# Patient Record
Sex: Male | Born: 1945 | Race: White | Hispanic: No | Marital: Married
Health system: Southern US, Community
[De-identification: ages and names within clinical notes are randomized; demographics above are authoritative.]

## PROBLEM LIST (undated history)

## (undated) HISTORY — PX: HERNIA REPAIR: SHX51

---

## 2006-08-11 ENCOUNTER — Other Ambulatory Visit: Payer: Self-pay

## 2006-08-15 ENCOUNTER — Ambulatory Visit: Payer: Self-pay | Admitting: Surgery

## 2007-07-29 ENCOUNTER — Emergency Department: Payer: Self-pay | Admitting: Emergency Medicine

## 2007-07-29 ENCOUNTER — Other Ambulatory Visit: Payer: Self-pay

## 2008-03-09 IMAGING — CT CT HEAD WITHOUT CONTRAST
2 series · 16 of 30 positions shown, 20 images · non-contrast
Comparison: none

REASON FOR EXAM: syncope
COMMENTS:

[Series 2: without · axial · non-contrast · 0.41mm/px · z∈[-144,-9]mm · 13 of 33 slices shown, 17 images]
[im 3/33  brain]
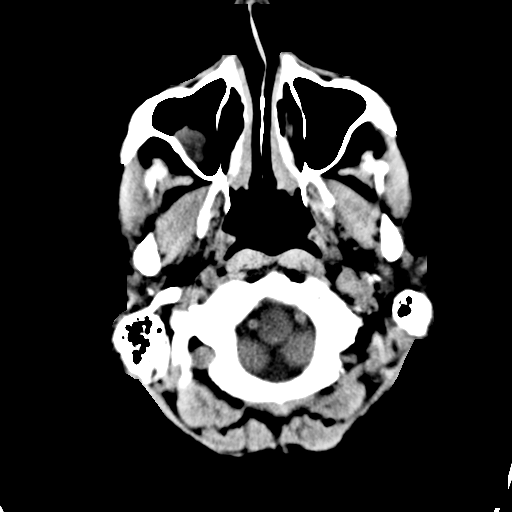
[im 3/33  bone]
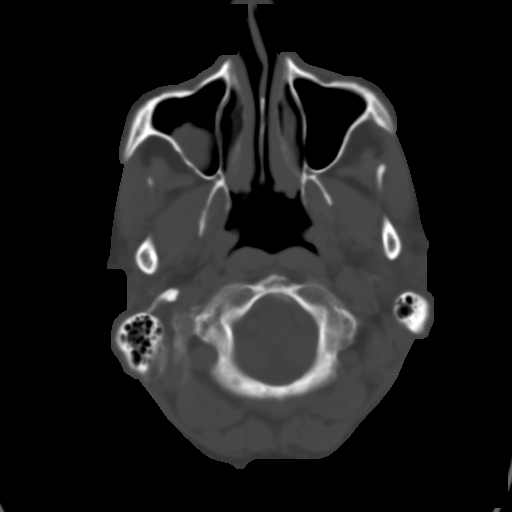
[im 5/33  brain]
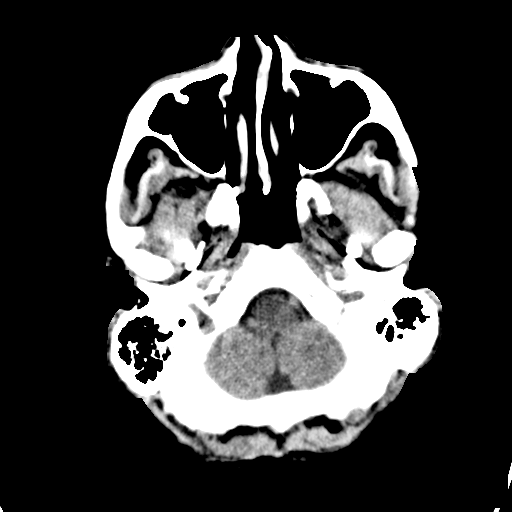
[im 7/33  brain]
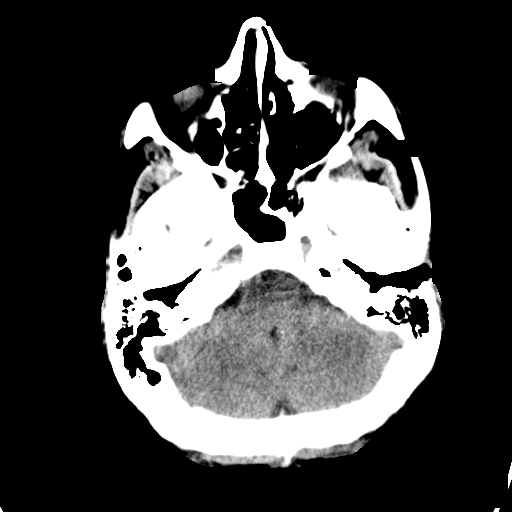
[im 10/33  brain]
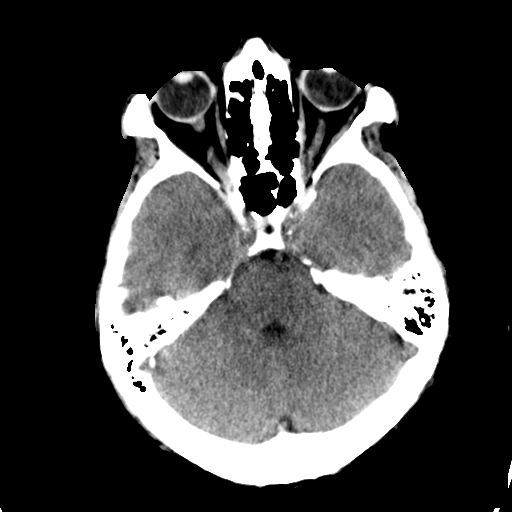
[im 12/33  brain]
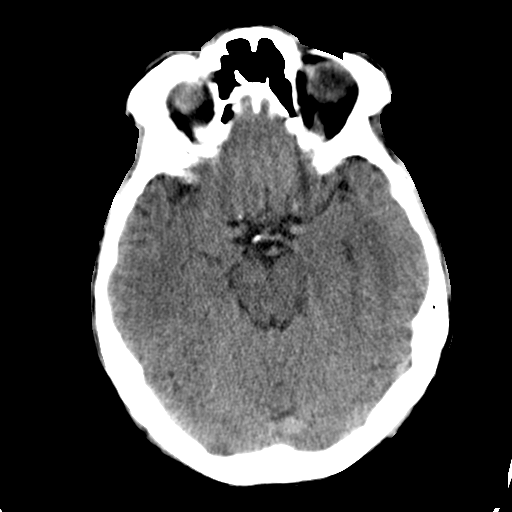
[im 12/33  bone]
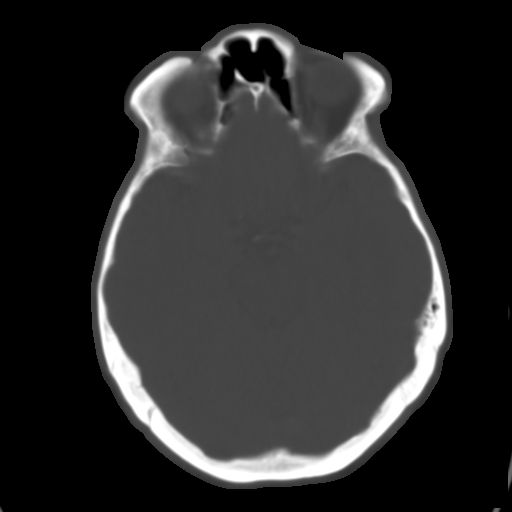
[im 14/33  brain]
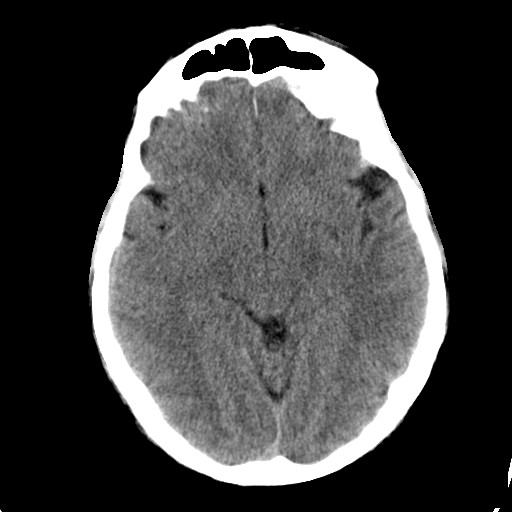
[im 17/33  brain]
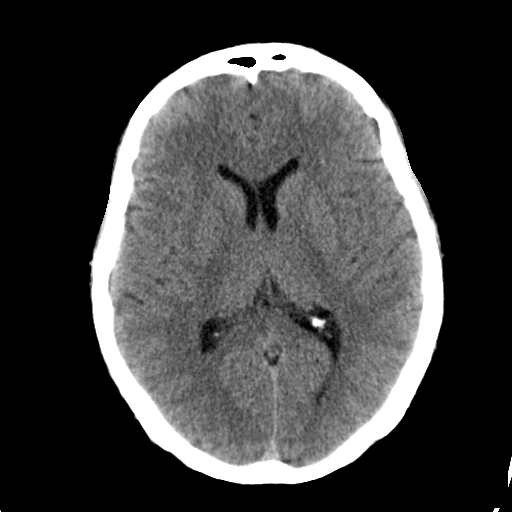
[im 19/33  brain]
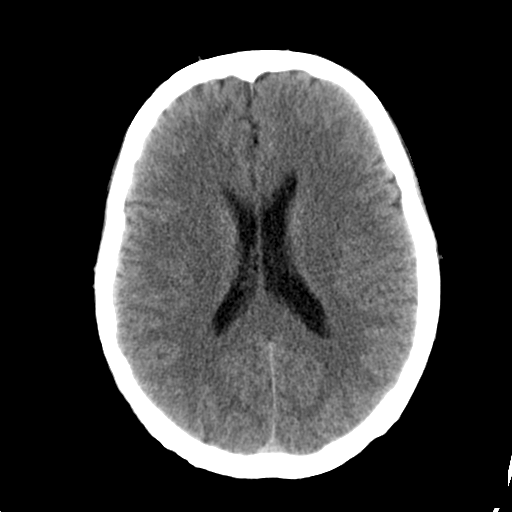
[im 21/33  brain]
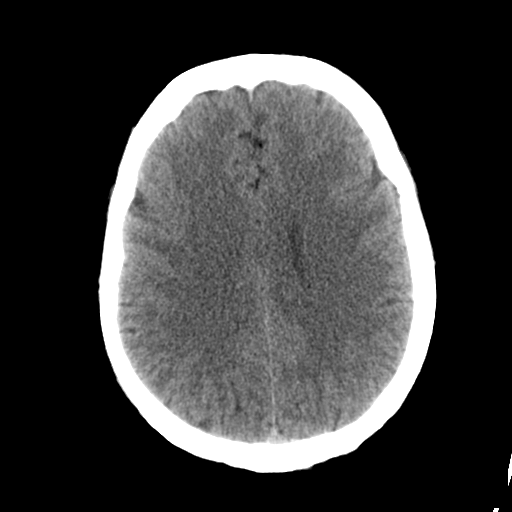
[im 21/33  bone]
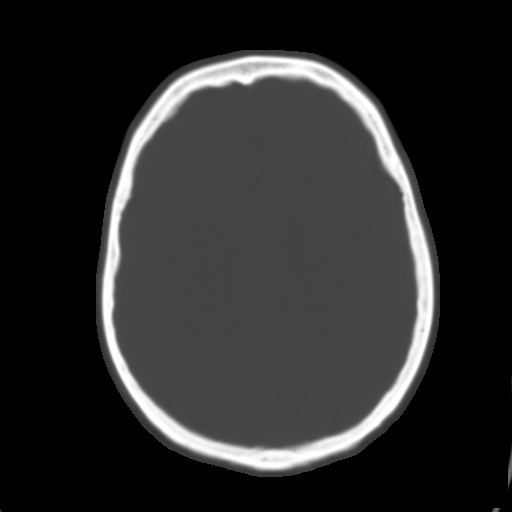
[im 23/33  brain]
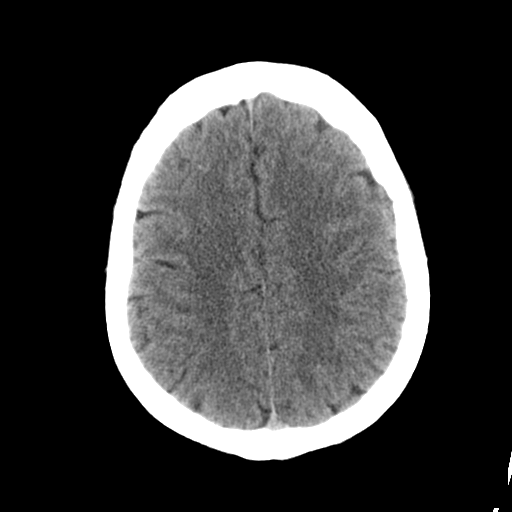
[im 26/33  brain]
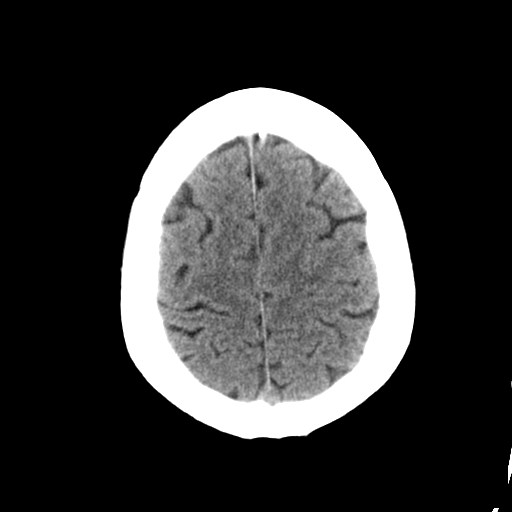
[im 28/33  brain]
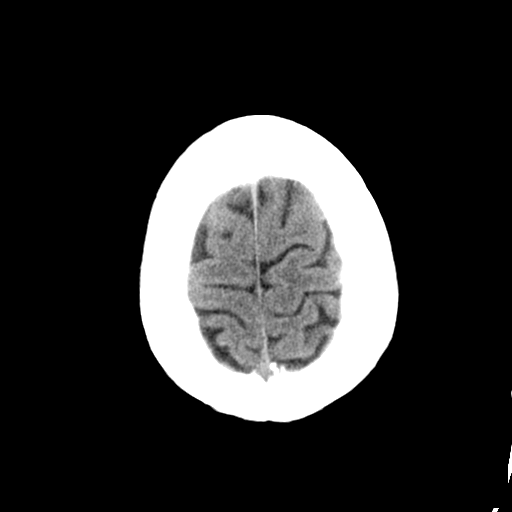
[im 30/33  brain]
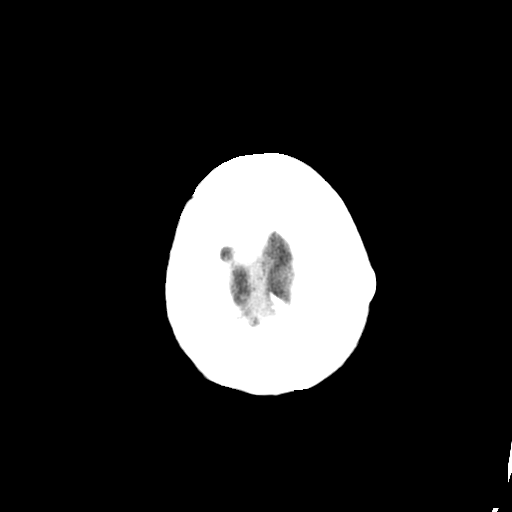
[im 30/33  bone]
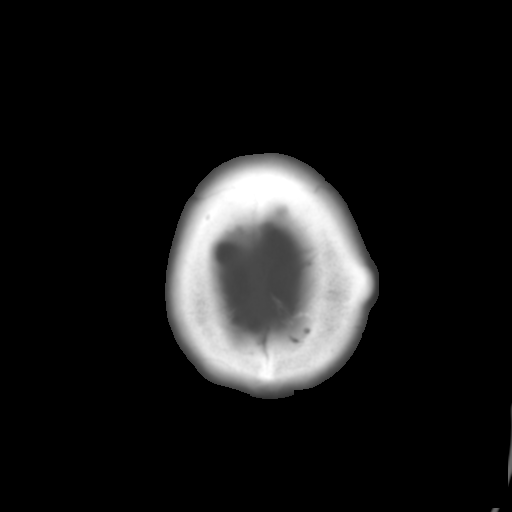

[Series 3: bone · axial · 0.41mm/px · z∈[-144,-99]mm · 3 of 33 slices shown]
[im 3/33  bone]
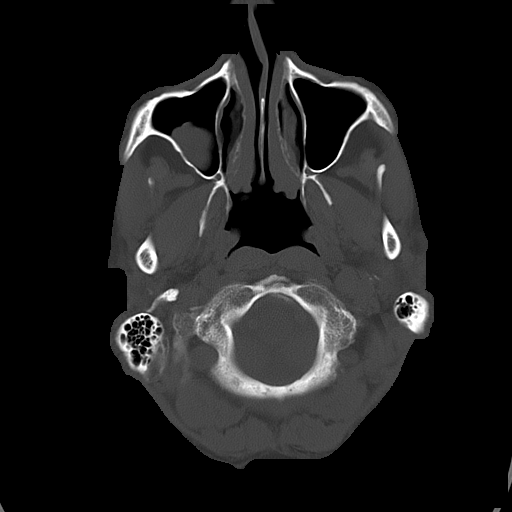
[im 7/33  bone]
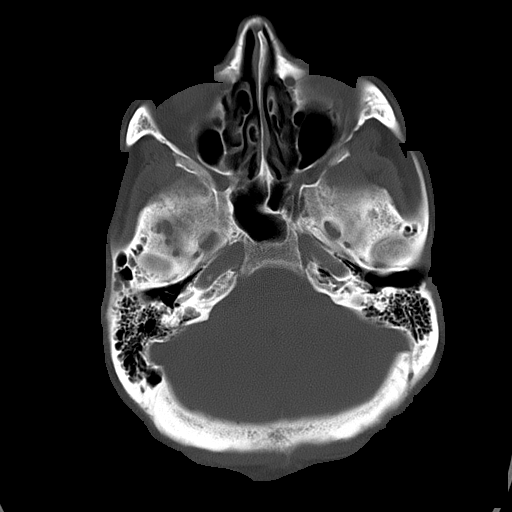
[im 12/33  bone]
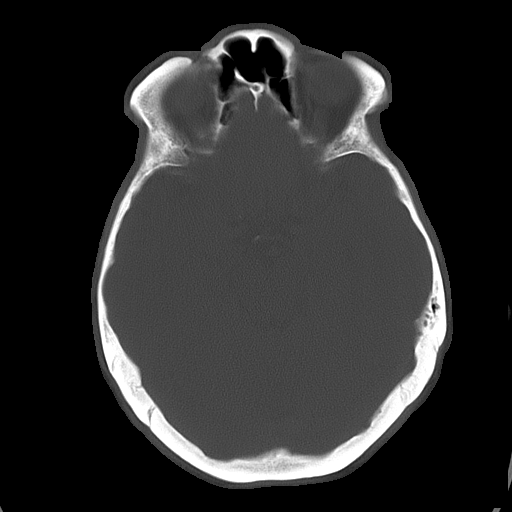

[16 of 30 positions shown; findings below may reference images not displayed]

PROCEDURE:     CT  - CT HEAD WITHOUT CONTRAST  - July 29, 2007  [DATE]

RESULT:     There is no evidence of intra-axial or extra-axial fluid
collections or evidence of acute hemorrhage. No secondary signs are
appreciated to suggest mass effect, subacute or chronic infarction. A
bulbous area of low attenuation projects within the base of the RIGHT
maxillary sinus possibly representing a mucous retention cyst.  The
visualized bony skeleton demonstrates no evidence of fracture or
dislocation.
IMPRESSION: No evidence of acute intracranial abnormalities.

Dr. Kenlyn of the Emergency Department was informed of these findings via
preliminary fax report on 07/29/07 at [DATE] p.m. CST.

## 2013-10-30 ENCOUNTER — Encounter: Payer: Self-pay | Admitting: *Deleted

## 2013-11-05 ENCOUNTER — Telehealth: Payer: Self-pay | Admitting: *Deleted

## 2013-11-05 ENCOUNTER — Ambulatory Visit (INDEPENDENT_AMBULATORY_CARE_PROVIDER_SITE_OTHER): Payer: Self-pay | Admitting: General Surgery

## 2013-11-05 ENCOUNTER — Encounter: Payer: Self-pay | Admitting: *Deleted

## 2013-11-05 DIAGNOSIS — N433 Hydrocele, unspecified: Secondary | ICD-10-CM

## 2013-11-05 NOTE — Telephone Encounter (Signed)
Message copied by Nicholes Mango on Mon Nov 05, 2013  4:19 PM ------      Message from: [place], [place] W      Created: Mon Nov 05, 2013  3:08 PM       Patient needs GU appt re: left hydrocele. With GU locally.  Thanks. ------

## 2013-11-05 NOTE — Telephone Encounter (Signed)
Patient agreeable to appointment later today.

## 2013-11-05 NOTE — Telephone Encounter (Signed)
Message for patient to call the office.  He has been scheduled for an appointment with Michiel Cowboy, P.A.  at [facility] for 11-26-13 at 3:15 pm. Their office will mail out new patient paperwork which he will need to complete and take to appointment.

## 2013-11-05 NOTE — Telephone Encounter (Signed)
Message copied by Nicholes Mango on Mon Nov 05, 2013  8:34 AM ------      Message from: Earline Mayotte      Created: Sun Nov 04, 2013 11:32 AM       Patient presently has an appt in January.  Contact at work [phone]) Monday AM and see if he can come in at 1 PM that afternoon.  He has a left inguinal hernia, and is looking to have surgery on 12/ 26.  Thanks.  (He is my Paediatric nurse) ------

## 2013-11-06 ENCOUNTER — Telehealth: Payer: Self-pay

## 2013-11-06 ENCOUNTER — Telehealth: Payer: Self-pay | Admitting: *Deleted

## 2013-11-06 DIAGNOSIS — N433 Hydrocele, unspecified: Secondary | ICD-10-CM | POA: Insufficient documentation

## 2013-11-06 NOTE — Progress Notes (Signed)
Patient ID: Joe Yoder, male   DOB: 13-Jul-1946, 67 y.o.   MRN: [id number]  No chief complaint on file.   HPI Joe Yoder is a 67 y.o. male.     HPI Comments: The patient reports a 2 week history of swelling in the left scrotum. 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   The patient noticed soreness the day of his vigorous activity in the lower abdomen, but this resolved within 40 hours. It was several days later the became aware of swelling in the left hemiscrotum.  He has not had any difficulty with bowel or bladder function. No difficulty with appetite. No urinary symptoms. No frequency or dysuria.  The patient previously underwent right than left laparoscopic inguinal hernia repair by Marshia Ly, M.D. in 2006/2007.  At that time he had had bulges in the groin rather than the scrotum.   No past medical history on file.  Past Surgical History  Procedure Laterality Date  . Hernia repair Bilateral 2007,2009    No family history on file.  Social History History  Substance Use Topics  . Smoking status: Not on file  . Smokeless tobacco: Not on file  . Alcohol Use: Not on file    No Known Allergies  No current outpatient prescriptions on file.   No current facility-administered medications for this visit.    Review of Systems Review of Systems  There were no vitals taken for this visit.  Physical Exam Physical Exam  Constitutional: He appears well-developed and well-nourished.  HENT:  Head: Normocephalic.  Neck: Normal range of motion.  Cardiovascular: Normal rate and regular rhythm.   Pulmonary/Chest: Effort normal and breath sounds normal.  Abdominal: He exhibits no mass. There is no tenderness. Hernia confirmed negative in the right inguinal area and confirmed negative in the left inguinal area.  Genitourinary: Penis normal.    Left testis shows swelling.  Lymphadenopathy:       Right: No inguinal  adenopathy present.       Left: No inguinal adenopathy present.    Data Reviewed 2006 right laparoscopic hernia repair notes.  Assessment    Left scrotal hydrocele, no evidence of recurrent hernia.    Plan    Indication for urology assessment was discussed.  As he is asymptomatic, this will be an elective appointment.        Joe Yoder 11/06/2013, 10:26 AM

## 2013-11-06 NOTE — Telephone Encounter (Signed)
Patient notified of date, time, and instructions. He verbalizes understanding.

## 2013-11-06 NOTE — Telephone Encounter (Signed)
Patient called back and was notified that he has been scheduled for an appointment with Michiel Cowboy, P.A. at [facility] for 11-26-13 at 3:15 pm. Their office will mail out new patient paperwork which he will need to complete and take to appointment.

## 2013-11-29 ENCOUNTER — Ambulatory Visit: Payer: Self-pay | Admitting: Urology

## 2013-12-03 ENCOUNTER — Ambulatory Visit: Payer: Self-pay | Admitting: General Surgery

## 2013-12-17 ENCOUNTER — Ambulatory Visit: Payer: Self-pay | Admitting: Unknown Physician Specialty

## 2013-12-21 ENCOUNTER — Ambulatory Visit: Payer: Self-pay | Admitting: Urology

## 2014-07-11 IMAGING — US US SCROTUM W/ DOPPLER COMPLETE
1 series · 13 of 25 positions shown · non-contrast
Comparison: None

CLINICAL DATA: Left testicular swelling

EXAM:
SCROTAL ULTRASOUND
DOPPLER ULTRASOUND OF THE TESTICLES
TECHNIQUE: Complete ultrasound examination of the testicles, epididymis, and
other scrotal structures was performed. Color and spectral Doppler
ultrasound were also utilized to evaluate blood flow to the
testicles.

[Series 1: us scrotum w/ doppler complete · 0.11mm/px · 13 of 61 slices shown]
[im 1/61]
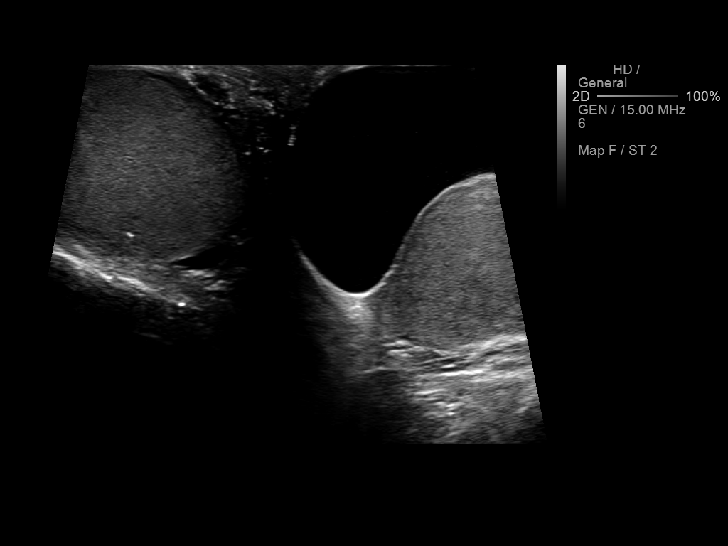
[im 6/61]
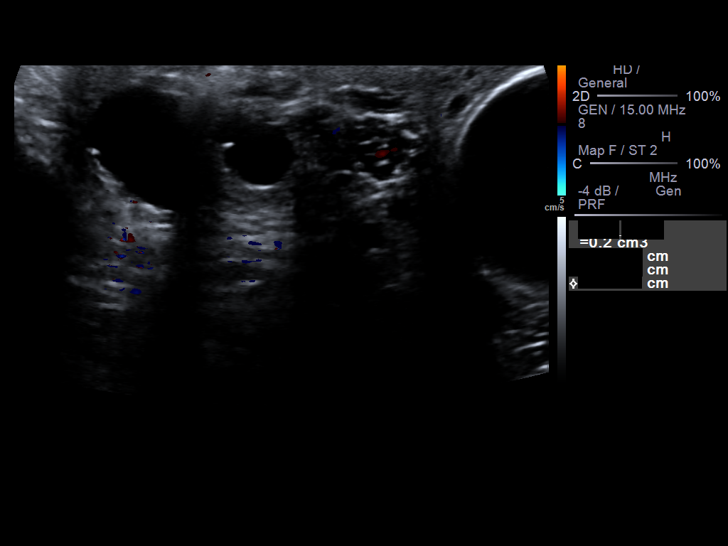
[im 11/61]
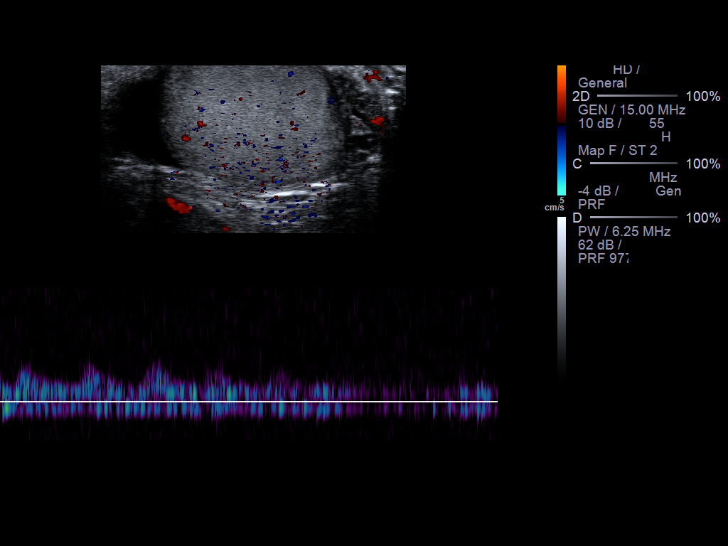
[im 16/61]
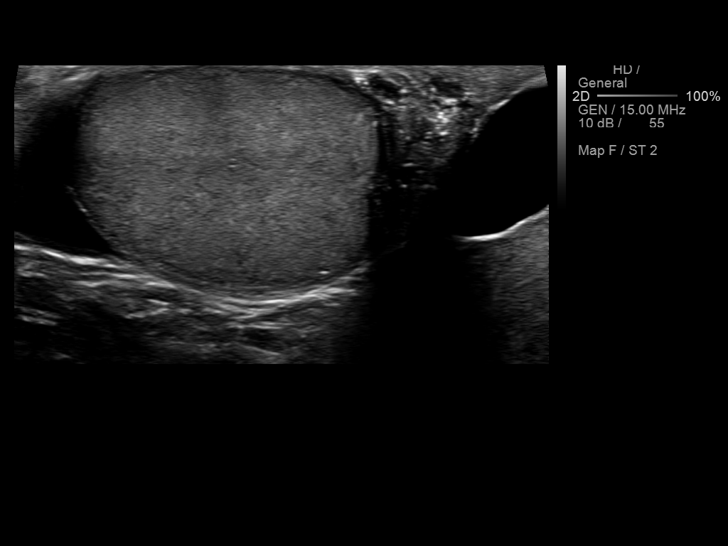
[im 21/61]
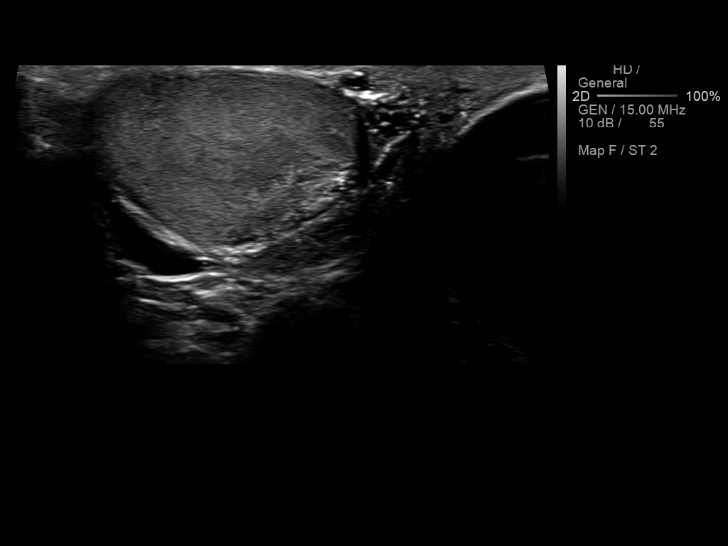
[im 26/61]
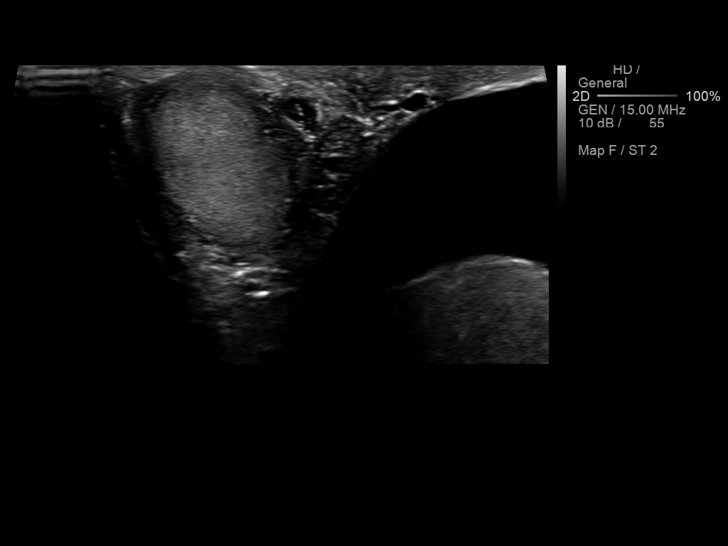
[im 31/61]
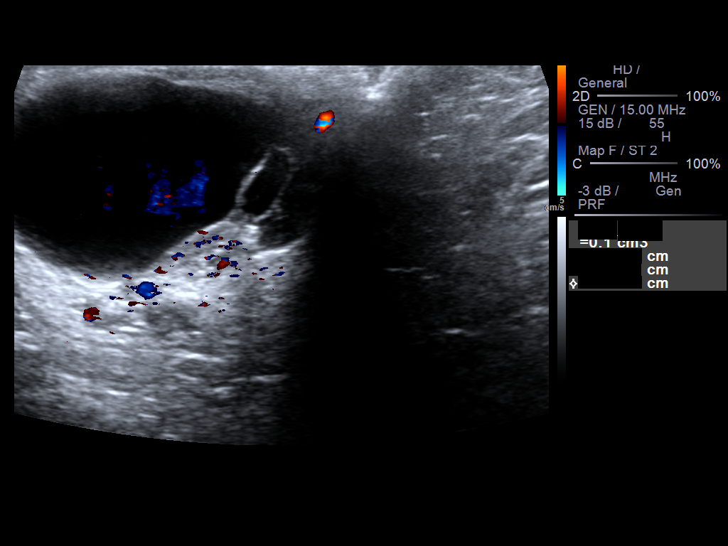
[im 36/61]
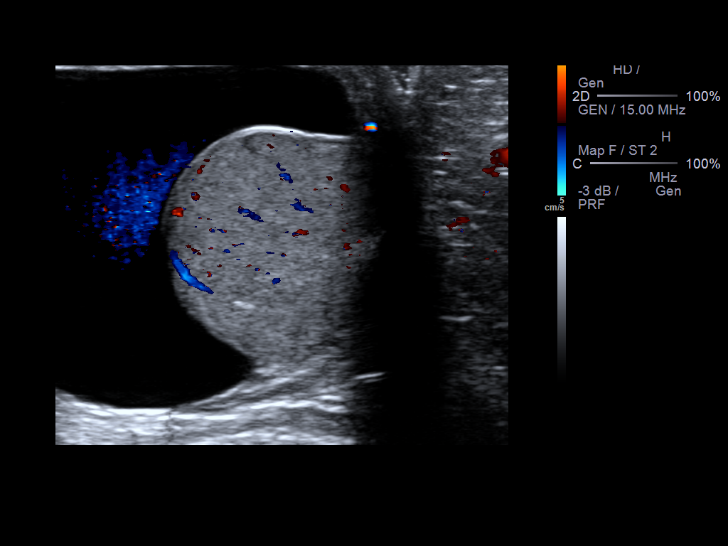
[im 41/61]
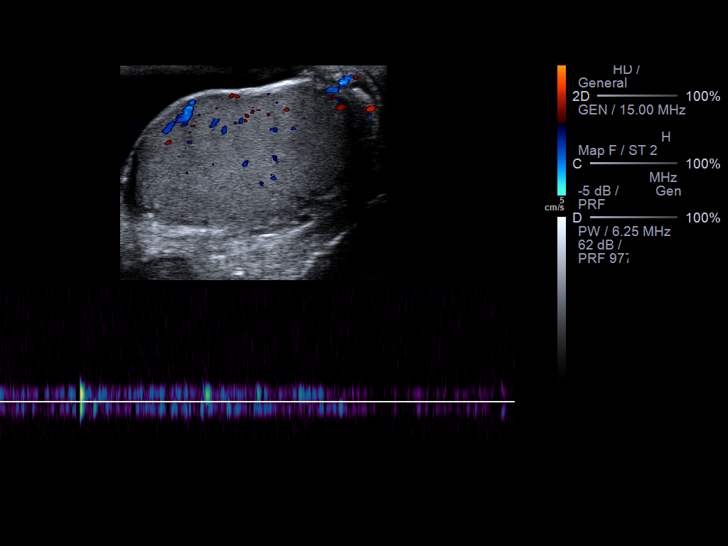
[im 46/61]
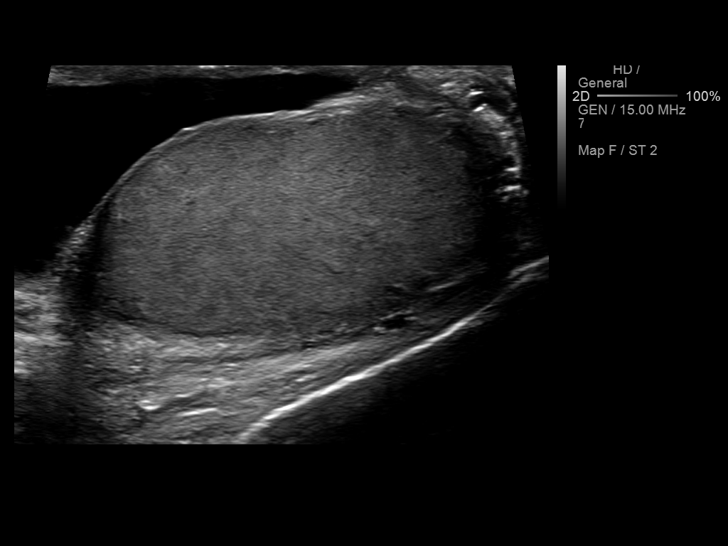
[im 51/61]
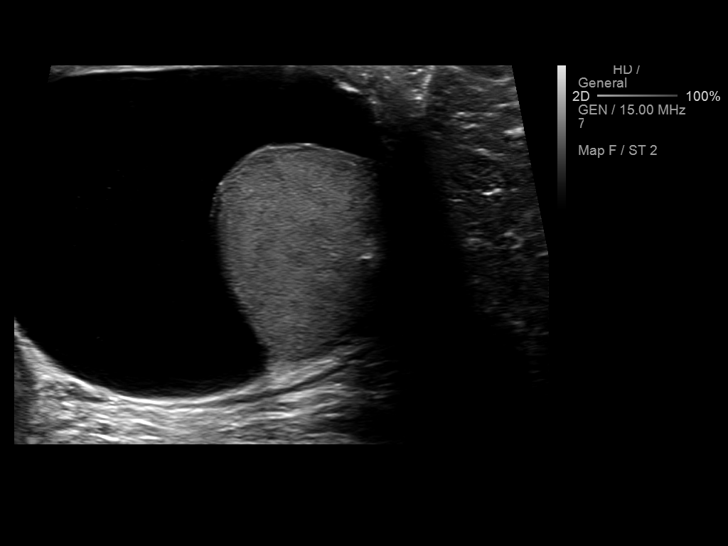
[im 56/61]
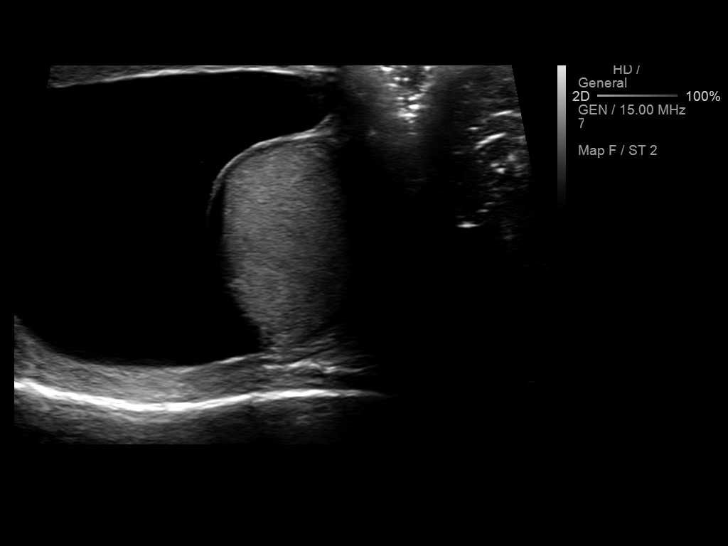
[im 61/61]
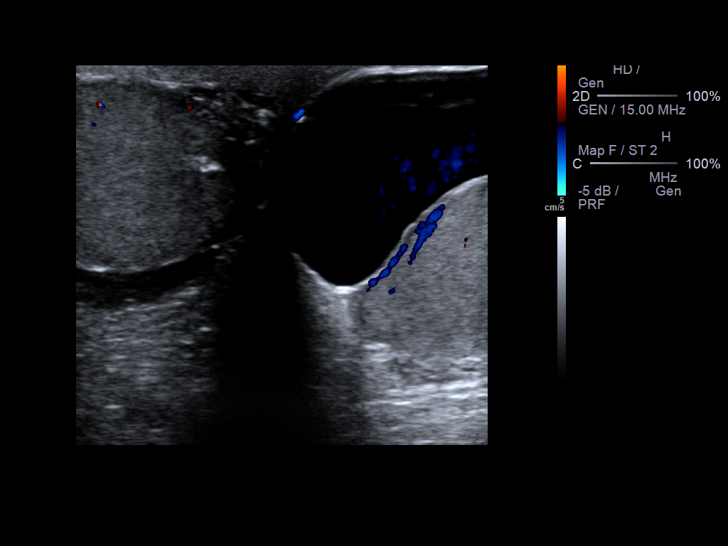

[13 of 25 positions shown; findings below may reference images not displayed]

FINDINGS: Right testicle

Measurements: 3.7 x 2.3 x 3.2 cm. Normal parenchymal echogenicity
without mass. Single calcification noted. Internal blood flow
present on color Doppler imaging.

Left testicle

Measurements: 5.4 x 2.6 x 2.6 cm.. Normal echogenicity with several
tiny calcifications. Appendix testis noted. No mass identified.
Internal blood flow present on color Doppler imaging.

Right epididymis: Small cyst within right epididymis 8 x 7 x 8 mm at
head

Left epididymis:  Multiple small cysts largest 8 x 4 x 4 mm

Hydrocele:  Large left hydrocele identified.  No right hydrocele.

Varicocele:  Absent bilaterally

Pulsed Doppler interrogation of both testes demonstrates low
resistance arterial and venous waveforms bilaterally.
IMPRESSION: Large left hydrocele.

Bilateral small epididymal cyst versus spermatoceles.

Few small nonspecific bilateral testicular calcifications.

## 2015-03-08 NOTE — Op Note (Signed)
PATIENT NAME:  Joe Yoder, Joe Yoder MR#:  [id number] DATE OF BIRTH:  Jul 09, 1946  DATE OFOpal Yoder PROCEDURE:  12/21/2013  PREOPERATIVE DIAGNOSIS: Left hydrocele.  POSTOPERATIVE DIAGNOSIS: Left hydrocele.  PROCEDURE: Left hydrocelectomy.   SURGEON: Trey Paulaichard Yesly Gerety, DO  BLOOD LOSS: Zero.   ANESTHESIA: General with local into the spermatic cord with 9 mL of Marcaine and Xylocaine combination without epinephrine.   DESCRIPTION OF PROCEDURE: The patient was sterilely prepped and draped in supine position. After an appropriate timeout, we make a midline incision after injecting the cord in the incision area with Marcaine/Xylocaine combination. Incision is carried very quickly down and the hydrocele is opened. About 50 mL of fluid are taken out of it. I used cautery for this. After incising it we imbricated behind the cord. We are very careful not to cause any vascular problems. So once it is marsupialized and imbricated behind the cord with 3-0 Vicryl running suture, I use 3-0 Vicryl to close the tunics and the skin in one contiguous running suture caudad and upward to the cephalad position and then cephalad down to the caudad position of the incision using a subcuticular suture. Dermabond is placed on the skin and he is sent to recovery in satisfactory condition. There was minimal bleeding in this patient and truly one of the least bleeding hydroceles I have ever done so I did not leave a drain. He is given Toradol postoperatively for pain in the OR and he is sent to recovery in satisfactory condition.  ____________________________ Caralyn Guileichard D. Edwyna ShellHart, DO rdh:sb D: 12/21/2013 13:20:03 ET T: 12/21/2013 14:46:45 ET JOB#: [id number]  cc: Caralyn Guileichard D. Edwyna ShellHart, DO, <Dictator> Khrystyne Arpin D Kathlyne Loud DO ELECTRONICALLY SIGNED 01/18/2014 14:25

## 2018-09-18 ENCOUNTER — Encounter: Payer: Self-pay | Admitting: Podiatry

## 2018-09-18 ENCOUNTER — Ambulatory Visit (INDEPENDENT_AMBULATORY_CARE_PROVIDER_SITE_OTHER): Payer: Medicare Other | Admitting: Podiatry

## 2018-09-18 DIAGNOSIS — M21622 Bunionette of left foot: Secondary | ICD-10-CM

## 2018-09-18 DIAGNOSIS — M21621 Bunionette of right foot: Secondary | ICD-10-CM | POA: Diagnosis not present

## 2018-09-18 DIAGNOSIS — Q828 Other specified congenital malformations of skin: Secondary | ICD-10-CM | POA: Diagnosis not present

## 2018-09-18 NOTE — Progress Notes (Signed)
Complaint:  Visit Type: Patient presents  to my office for  preventative foot care services. Complaint: Patient states" my calluses both feet are painful walking and wearing my shoes.  His wife previously trimmed his calluses. The patient presents for preventative foot care services.  Podiatric Exam: Vascular: dorsalis pedis and posterior tibial pulses are palpable bilateral. Capillary return is immediate. Temperature gradient is WNL. Skin turgor WNL  Sensorium: Normal Semmes Weinstein monofilament test. Normal tactile sensation bilaterally. Nail Exam: Pt has thick disfigured discolored nails asymptomatic nails both feet. Ulcer Exam: There is no evidence of ulcer or pre-ulcerative changes or infection. Orthopedic Exam: Muscle tone and strength are WNL. No limitations in general ROM. No crepitus or effusions noted. Foot type and digits show no abnormalities. Tailors bunion 5th MPJ  B/L Skin: No Porokeratosis. 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  Treatment & Plan Procedures and Treatment: Consent by patient was obtained for treatment procedures.   Debridement of porokeratotic tissues  B/L.   No ulceration, no infection noted. Discussed surgical consideration. Return Visit-Office Procedure: Patient instructed to return to the office for a follow up visit 4 months for continued evaluation and treatment.    Helane Gunther DPM

## 2018-12-11 ENCOUNTER — Encounter: Payer: Self-pay | Admitting: Podiatry

## 2018-12-11 ENCOUNTER — Ambulatory Visit (INDEPENDENT_AMBULATORY_CARE_PROVIDER_SITE_OTHER): Payer: Medicare Other | Admitting: Podiatry

## 2018-12-11 DIAGNOSIS — Q828 Other specified congenital malformations of skin: Secondary | ICD-10-CM

## 2018-12-11 DIAGNOSIS — M21621 Bunionette of right foot: Secondary | ICD-10-CM

## 2018-12-11 DIAGNOSIS — M21622 Bunionette of left foot: Secondary | ICD-10-CM

## 2018-12-11 NOTE — Progress Notes (Signed)
Complaint:  Visit Type: Patient presents  to my office for  preventative foot care services. Complaint: Patient states" my calluses both feet are painful walking and wearing my shoes.   The patient presents for preventative foot care services.  Podiatric Exam: Vascular: dorsalis pedis and posterior tibial pulses are palpable bilateral. Capillary return is immediate. Temperature gradient is WNL. Skin turgor WNL  Sensorium: Normal Semmes Weinstein monofilament test. Normal tactile sensation bilaterally. Nail Exam: Pt has thick disfigured discolored nails asymptomatic nails both feet. Ulcer Exam: There is no evidence of ulcer or pre-ulcerative changes or infection. Orthopedic Exam: Muscle tone and strength are WNL. No limitations in general ROM. No crepitus or effusions noted. Foot type and digits show no abnormalities. Tailors bunion 5th MPJ  B/L Skin:  Porokeratosis. Sub 5th met  B/L. 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  Treatment & Plan Procedures and Treatment: Consent by patient was obtained for treatment procedures.   Debridement of porokeratotic tissues  B/L.   No ulceration, no infection noted. Discussed surgical consideration. Return Visit-Office Procedure: Patient instructed to return to the office for a follow up visit 3 months for continued evaluation and treatment.    Gardiner Barefoot DPM

## 2019-01-15 ENCOUNTER — Ambulatory Visit: Payer: PRIVATE HEALTH INSURANCE | Admitting: Podiatry

## 2019-03-12 ENCOUNTER — Ambulatory Visit (INDEPENDENT_AMBULATORY_CARE_PROVIDER_SITE_OTHER): Payer: Medicare Other | Admitting: Podiatry

## 2019-03-12 ENCOUNTER — Other Ambulatory Visit: Payer: Self-pay

## 2019-03-12 ENCOUNTER — Encounter: Payer: Self-pay | Admitting: Podiatry

## 2019-03-12 VITALS — Temp 98.3°F

## 2019-03-12 DIAGNOSIS — M21621 Bunionette of right foot: Secondary | ICD-10-CM | POA: Diagnosis not present

## 2019-03-12 DIAGNOSIS — Q828 Other specified congenital malformations of skin: Secondary | ICD-10-CM | POA: Diagnosis not present

## 2019-03-12 DIAGNOSIS — M21622 Bunionette of left foot: Secondary | ICD-10-CM

## 2019-03-12 NOTE — Progress Notes (Signed)
Complaint:  Visit Type: Patient presents  to my office for  preventative foot care services. Complaint: Patient states" my calluses both feet are painful walking and wearing my shoes.   The patient presents for preventative foot care services.  Podiatric Exam: Vascular: dorsalis pedis and posterior tibial pulses are palpable bilateral. Capillary return is immediate. Temperature gradient is WNL. Skin turgor WNL  Sensorium: Normal Semmes Weinstein monofilament test. Normal tactile sensation bilaterally. Nail Exam: Pt has thick disfigured discolored nails asymptomatic nails both feet. Ulcer Exam: There is no evidence of ulcer or pre-ulcerative changes or infection. Orthopedic Exam: Muscle tone and strength are WNL. No limitations in general ROM. No crepitus or effusions noted. Foot type and digits show no abnormalities. Tailors bunion 5th MPJ  B/L Skin:  Porokeratosis. Sub 5th met  B/L. 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  Treatment & Plan Procedures and Treatment: Consent by patient was obtained for treatment procedures.   Debridement of porokeratotic tissues  B/L.   No ulceration, no infection noted. Discussed surgical consideration. Return Visit-Office Procedure: Patient instructed to return to the office for a follow up visit 3 months for continued evaluation and treatment.    Jocelyne Reinertsen DPM 

## 2019-06-11 ENCOUNTER — Encounter: Payer: Self-pay | Admitting: Podiatry

## 2019-06-11 ENCOUNTER — Ambulatory Visit (INDEPENDENT_AMBULATORY_CARE_PROVIDER_SITE_OTHER): Payer: Medicare Other | Admitting: Podiatry

## 2019-06-11 ENCOUNTER — Other Ambulatory Visit: Payer: Self-pay

## 2019-06-11 VITALS — Temp 98.6°F

## 2019-06-11 DIAGNOSIS — Q828 Other specified congenital malformations of skin: Secondary | ICD-10-CM | POA: Diagnosis not present

## 2019-06-11 DIAGNOSIS — M21622 Bunionette of left foot: Secondary | ICD-10-CM

## 2019-06-11 DIAGNOSIS — M21621 Bunionette of right foot: Secondary | ICD-10-CM

## 2019-06-11 NOTE — Progress Notes (Signed)
This patient present to the office  with chief complaint of callus developing under the outside of ball of both feet.  She says this callus has become painful walking and wearing her shoes. Patient has provided no  treatment or sought professional help.  He  presents to the office for treatment of her painful callus.  Vascular  Dorsalis pedis and posterior tibial pulses are palpable  B/L.  Capillary return  WNL.  Temperature gradient is  WNL.  Skin turgor  WNL  Sensorium  Senn Weinstein monofilament wire  WNL. Normal tactile sensation.  Nail Exam  Patient has normal nails with no evidence of bacterial or fungal infection.  Orthopedic  Exam  Muscle tone and muscle strength  WNL.  No limitations of motion feet  B/L.  No crepitus or joint effusion noted.  Foot type is unremarkable and digits show no abnormalities.  Bony prominences are unremarkable.  Plantar flexed fifth metatarsal  B/L.  Skin  No open lesions.  Normal skin texture and turgor.  Callus/porokeratosis  sub 5th  B/L  Porokeratosis secondary plantar flexed fifth metatarsal  B/L  IE  Debride callus/porokeratosis.  Discussed condition with patient.  Gardiner Barefoot DPM

## 2019-09-10 ENCOUNTER — Encounter: Payer: Self-pay | Admitting: Podiatry

## 2019-09-10 ENCOUNTER — Ambulatory Visit: Payer: Medicare Other | Admitting: Podiatry

## 2019-09-10 ENCOUNTER — Other Ambulatory Visit: Payer: Self-pay

## 2019-09-10 ENCOUNTER — Ambulatory Visit (INDEPENDENT_AMBULATORY_CARE_PROVIDER_SITE_OTHER): Payer: Medicare Other | Admitting: Podiatry

## 2019-09-10 DIAGNOSIS — M21622 Bunionette of left foot: Secondary | ICD-10-CM | POA: Diagnosis not present

## 2019-09-10 DIAGNOSIS — M21621 Bunionette of right foot: Secondary | ICD-10-CM

## 2019-09-10 DIAGNOSIS — Q828 Other specified congenital malformations of skin: Secondary | ICD-10-CM

## 2019-09-10 NOTE — Progress Notes (Signed)
This patient present to the office  with chief complaint of callus developing under the outside of ball of both feet.  She says this callus has become painful walking and wearing her shoes. Patient has provided no  treatment or sought professional help.  He  presents to the office for treatment of her painful callus.  Vascular  Dorsalis pedis and posterior tibial pulses are palpable  B/L.  Capillary return  WNL.  Temperature gradient is  WNL.  Skin turgor  WNL  Sensorium  Senn Weinstein monofilament wire  WNL. Normal tactile sensation.  Nail Exam  Patient has normal nails with no evidence of bacterial or fungal infection.  Orthopedic  Exam  Muscle tone and muscle strength  WNL.  No limitations of motion feet  B/L.  No crepitus or joint effusion noted.  Foot type is unremarkable and digits show no abnormalities.  Bony prominences are unremarkable.  Plantar flexed fifth metatarsal  B/L.  Skin  No open lesions.  Normal skin texture and turgor.  Callus/porokeratosis  sub 5th  B/L  Porokeratosis secondary plantar flexed fifth metatarsal  B/L  IE  Debride callus/porokeratosis.  Discussed condition with patient.  Gardiner Barefoot DPM

## 2019-12-10 ENCOUNTER — Ambulatory Visit: Payer: PRIVATE HEALTH INSURANCE | Admitting: Podiatry

## 2019-12-10 ENCOUNTER — Encounter: Payer: Self-pay | Admitting: Podiatry

## 2019-12-10 ENCOUNTER — Other Ambulatory Visit: Payer: Self-pay

## 2019-12-10 ENCOUNTER — Ambulatory Visit (INDEPENDENT_AMBULATORY_CARE_PROVIDER_SITE_OTHER): Payer: Medicare Other | Admitting: Podiatry

## 2019-12-10 DIAGNOSIS — M21622 Bunionette of left foot: Secondary | ICD-10-CM

## 2019-12-10 DIAGNOSIS — M21621 Bunionette of right foot: Secondary | ICD-10-CM | POA: Diagnosis not present

## 2019-12-10 DIAGNOSIS — Q828 Other specified congenital malformations of skin: Secondary | ICD-10-CM | POA: Diagnosis not present

## 2019-12-10 NOTE — Progress Notes (Signed)
This patient present to the office  with chief complaint of callus developing under the outside of ball of both feet.  She says this callus has become painful walking and wearing his shoes. Patient has provided no  treatment or sought professional help.  He  presents to the office for treatment of his painful callus.  Vascular  Dorsalis pedis and posterior tibial pulses are palpable  B/L.  Capillary return  WNL.  Temperature gradient is  WNL.  Skin turgor  WNL  Sensorium  Senn Weinstein monofilament wire  WNL. Normal tactile sensation.  Nail Exam  Patient has normal nails with no evidence of bacterial or fungal infection.  Orthopedic  Exam  Muscle tone and muscle strength  WNL.  No limitations of motion feet  B/L.  No crepitus or joint effusion noted.  Foot type is unremarkable and digits show no abnormalities.  Bony prominences are unremarkable.  Plantar flexed fifth metatarsal  B/L.  Skin  No open lesions.  Normal skin texture and turgor.  Callus/porokeratosis  sub 5th  B/L  Porokeratosis secondary plantar flexed fifth metatarsal  B/L  IE  Debride callus/porokeratosis.  Discussed condition with patient.  Helane Gunther DPM

## 2020-02-23 ENCOUNTER — Ambulatory Visit: Payer: Self-pay | Attending: Internal Medicine

## 2020-02-23 DIAGNOSIS — Z23 Encounter for immunization: Secondary | ICD-10-CM

## 2020-02-23 NOTE — Progress Notes (Signed)
   [id number]  Name:  [facility]    MRN: [id number] DOB: Jul 03, 1946  02/23/2020  Joe Yoder was observed post Covid-19 immunization for 15 minutes without incident. He was provided with Vaccine Information Sheet and instruction to access the V-Safe system.   Joe Yoder was instructed to call 911 with any severe reactions post vaccine: Marland Kitchen Difficulty breathing  . Swelling of face and throat  . A fast heartbeat  . A bad rash all over body  . Dizziness and weakness   Immunizations Administered    Name Date Dose VIS Date Route   Pfizer COVID-19 Vaccine 02/23/2020  1:19 PM 0.3 mL 10/26/2019 Intramuscular   Manufacturer: [employer], [employer]   Lot: [id number]   NDC: [address]-3

## 2020-03-10 ENCOUNTER — Encounter: Payer: Self-pay | Admitting: Podiatry

## 2020-03-10 ENCOUNTER — Ambulatory Visit (INDEPENDENT_AMBULATORY_CARE_PROVIDER_SITE_OTHER): Payer: Medicare Other | Admitting: Podiatry

## 2020-03-10 ENCOUNTER — Other Ambulatory Visit: Payer: Self-pay

## 2020-03-10 DIAGNOSIS — M21622 Bunionette of left foot: Secondary | ICD-10-CM

## 2020-03-10 DIAGNOSIS — Q828 Other specified congenital malformations of skin: Secondary | ICD-10-CM

## 2020-03-10 DIAGNOSIS — M21621 Bunionette of right foot: Secondary | ICD-10-CM

## 2020-03-10 NOTE — Progress Notes (Signed)
This patient present to the office  with chief complaint of callus developing under the outside of ball of both feet.  She says this callus has become painful walking and wearing his shoes. Patient has provided no  treatment or sought professional help.  He  presents to the office for treatment of his painful callus.  Vascular  Dorsalis pedis and posterior tibial pulses are palpable  B/L.  Capillary return  WNL.  Temperature gradient is  WNL.  Skin turgor  WNL  Sensorium  Senn Weinstein monofilament wire  WNL. Normal tactile sensation.  Nail Exam  Patient has normal nails with no evidence of bacterial or fungal infection.  Orthopedic  Exam  Muscle tone and muscle strength  WNL.  No limitations of motion feet  B/L.  No crepitus or joint effusion noted.  Foot type is unremarkable and digits show no abnormalities.  Bony prominences are unremarkable.  Plantar flexed fifth metatarsal  B/L.  Skin  No open lesions.  Normal skin texture and turgor.  Callus/porokeratosis  sub 5th  B/L  Porokeratosis secondary plantar flexed fifth metatarsal  B/L   Debride callus/porokeratosis.  Discussed condition with patient.  RTC prn  Helane Gunther DPM

## 2020-03-18 ENCOUNTER — Ambulatory Visit: Payer: Medicare Other | Attending: Internal Medicine

## 2020-06-09 ENCOUNTER — Encounter: Payer: Self-pay | Admitting: Podiatry

## 2020-06-09 ENCOUNTER — Ambulatory Visit (INDEPENDENT_AMBULATORY_CARE_PROVIDER_SITE_OTHER): Payer: Medicare Other | Admitting: Podiatry

## 2020-06-09 ENCOUNTER — Other Ambulatory Visit: Payer: Self-pay

## 2020-06-09 DIAGNOSIS — M21621 Bunionette of right foot: Secondary | ICD-10-CM

## 2020-06-09 DIAGNOSIS — Q828 Other specified congenital malformations of skin: Secondary | ICD-10-CM

## 2020-06-09 DIAGNOSIS — M21622 Bunionette of left foot: Secondary | ICD-10-CM

## 2020-06-09 NOTE — Progress Notes (Signed)
This patient present to the office  with chief complaint of callus developing under the outside of ball of both feet.  She says this callus has become painful walking and wearing his shoes. Patient has provided no  treatment or sought professional help.  He  presents to the office for treatment of his painful callus.  Vascular  Dorsalis pedis and posterior tibial pulses are palpable  B/L.  Capillary return  WNL.  Temperature gradient is  WNL.  Skin turgor  WNL  Sensorium  Senn Weinstein monofilament wire  WNL. Normal tactile sensation.  Nail Exam  Patient has normal nails with no evidence of bacterial or fungal infection.  Orthopedic  Exam  Muscle tone and muscle strength  WNL.  No limitations of motion feet  B/L.  No crepitus or joint effusion noted.  Foot type is unremarkable and digits show no abnormalities.  Bony prominences are unremarkable.  Plantar flexed/tailors bunion  fifth metatarsal  B/L.  Skin  No open lesions.  Normal skin texture and turgor.  Callus/porokeratosis  sub 5th  B/L  Porokeratosis secondary plantar flexed fifth metatarsal  B/L   Debride callus/porokeratosis.  Discussed condition with patient.  RTC 3 months   Jamareon Shimel DPM    

## 2020-09-15 ENCOUNTER — Other Ambulatory Visit: Payer: Self-pay

## 2020-09-15 ENCOUNTER — Encounter: Payer: Self-pay | Admitting: Podiatry

## 2020-09-15 ENCOUNTER — Ambulatory Visit (INDEPENDENT_AMBULATORY_CARE_PROVIDER_SITE_OTHER): Payer: Medicare Other | Admitting: Podiatry

## 2020-09-15 DIAGNOSIS — M21621 Bunionette of right foot: Secondary | ICD-10-CM

## 2020-09-15 DIAGNOSIS — M21622 Bunionette of left foot: Secondary | ICD-10-CM | POA: Diagnosis not present

## 2020-09-15 DIAGNOSIS — Q828 Other specified congenital malformations of skin: Secondary | ICD-10-CM

## 2020-09-15 NOTE — Progress Notes (Signed)
This patient present to the office  with chief complaint of callus developing under the outside of ball of both feet.  She says this callus has become painful walking and wearing his shoes. Patient has provided no  treatment or sought professional help.  He  presents to the office for treatment of his painful callus.  Vascular  Dorsalis pedis and posterior tibial pulses are palpable  B/L.  Capillary return  WNL.  Temperature gradient is  WNL.  Skin turgor  WNL  Sensorium  Senn Weinstein monofilament wire  WNL. Normal tactile sensation.  Nail Exam  Patient has normal nails with no evidence of bacterial or fungal infection.  Orthopedic  Exam  Muscle tone and muscle strength  WNL.  No limitations of motion feet  B/L.  No crepitus or joint effusion noted.  Foot type is unremarkable and digits show no abnormalities.  Bony prominences are unremarkable.  Plantar flexed/tailors bunion  fifth metatarsal  B/L.  Skin  No open lesions.  Normal skin texture and turgor.  Callus/porokeratosis  sub 5th  B/L  Porokeratosis secondary plantar flexed fifth metatarsal  B/L   Debride callus/porokeratosis.  Discussed condition with patient.  RTC 3 months   Skylar Priest DPM    

## 2020-12-22 ENCOUNTER — Other Ambulatory Visit: Payer: Self-pay

## 2020-12-22 ENCOUNTER — Ambulatory Visit (INDEPENDENT_AMBULATORY_CARE_PROVIDER_SITE_OTHER): Payer: Medicare Other | Admitting: Podiatry

## 2020-12-22 ENCOUNTER — Encounter: Payer: Self-pay | Admitting: Podiatry

## 2020-12-22 DIAGNOSIS — L84 Corns and callosities: Secondary | ICD-10-CM | POA: Diagnosis not present

## 2020-12-22 DIAGNOSIS — M21622 Bunionette of left foot: Secondary | ICD-10-CM | POA: Diagnosis not present

## 2020-12-22 DIAGNOSIS — Q828 Other specified congenital malformations of skin: Secondary | ICD-10-CM

## 2020-12-22 DIAGNOSIS — M21621 Bunionette of right foot: Secondary | ICD-10-CM

## 2020-12-22 NOTE — Progress Notes (Signed)
This patient present to the office  with chief complaint of callus developing under the outside of ball of both feet.  She says this callus has become painful walking and wearing his shoes. Patient has provided no  treatment or sought professional help.  He  presents to the office for treatment of his painful callus.  Vascular  Dorsalis pedis and posterior tibial pulses are palpable  B/L.  Capillary return  WNL.  Temperature gradient is  WNL.  Skin turgor  WNL  Sensorium  Senn Weinstein monofilament wire  WNL. Normal tactile sensation.  Nail Exam  Patient has normal nails with no evidence of bacterial or fungal infection.  Orthopedic  Exam  Muscle tone and muscle strength  WNL.  No limitations of motion feet  B/L.  No crepitus or joint effusion noted.  Foot type is unremarkable and digits show no abnormalities.  Bony prominences are unremarkable.  Plantar flexed/tailors bunion  fifth metatarsal  B/L.  Skin  No open lesions.  Normal skin texture and turgor.  Callus/porokeratosis  sub 5th  B/L  Porokeratosis secondary plantar flexed fifth metatarsal  B/L   Debride callus/porokeratosis.  Discussed condition with patient.  RTC 3 months   Helane Gunther DPM

## 2021-03-23 ENCOUNTER — Encounter: Payer: Self-pay | Admitting: Podiatry

## 2021-03-23 ENCOUNTER — Ambulatory Visit (INDEPENDENT_AMBULATORY_CARE_PROVIDER_SITE_OTHER): Payer: Medicare Other | Admitting: Podiatry

## 2021-03-23 ENCOUNTER — Other Ambulatory Visit: Payer: Self-pay

## 2021-03-23 DIAGNOSIS — M21621 Bunionette of right foot: Secondary | ICD-10-CM

## 2021-03-23 DIAGNOSIS — Z9189 Other specified personal risk factors, not elsewhere classified: Secondary | ICD-10-CM | POA: Insufficient documentation

## 2021-03-23 DIAGNOSIS — E559 Vitamin D deficiency, unspecified: Secondary | ICD-10-CM | POA: Insufficient documentation

## 2021-03-23 DIAGNOSIS — L84 Corns and callosities: Secondary | ICD-10-CM | POA: Diagnosis not present

## 2021-03-23 DIAGNOSIS — I8390 Asymptomatic varicose veins of unspecified lower extremity: Secondary | ICD-10-CM | POA: Insufficient documentation

## 2021-03-23 DIAGNOSIS — Z860100 Personal history of colon polyps, unspecified: Secondary | ICD-10-CM | POA: Insufficient documentation

## 2021-03-23 DIAGNOSIS — Q828 Other specified congenital malformations of skin: Secondary | ICD-10-CM | POA: Diagnosis not present

## 2021-03-23 DIAGNOSIS — L219 Seborrheic dermatitis, unspecified: Secondary | ICD-10-CM | POA: Insufficient documentation

## 2021-03-23 DIAGNOSIS — M21622 Bunionette of left foot: Secondary | ICD-10-CM | POA: Diagnosis not present

## 2021-03-23 DIAGNOSIS — E781 Pure hyperglyceridemia: Secondary | ICD-10-CM | POA: Insufficient documentation

## 2021-03-23 DIAGNOSIS — I781 Nevus, non-neoplastic: Secondary | ICD-10-CM | POA: Insufficient documentation

## 2021-03-23 DIAGNOSIS — L719 Rosacea, unspecified: Secondary | ICD-10-CM | POA: Insufficient documentation

## 2021-03-23 NOTE — Progress Notes (Signed)
This patient present to the office  with chief complaint of callus developing under the outside of ball of both feet.  She says this callus has become painful walking and wearing his shoes. Patient has provided no  treatment or sought professional help.  He  presents to the office for treatment of his painful callus.  Vascular  Dorsalis pedis and posterior tibial pulses are palpable  B/L.  Capillary return  WNL.  Temperature gradient is  WNL.  Skin turgor  WNL  Sensorium  Senn Weinstein monofilament wire  WNL. Normal tactile sensation.  Nail Exam  Patient has normal nails with no evidence of bacterial or fungal infection.  Orthopedic  Exam  Muscle tone and muscle strength  WNL.  No limitations of motion feet  B/L.  No crepitus or joint effusion noted.  Foot type is unremarkable and digits show no abnormalities.  Bony prominences are unremarkable.  Plantar flexed/tailors bunion  fifth metatarsal  B/L.  Skin  No open lesions.  Normal skin texture and turgor.  Callus/porokeratosis  sub 5th  B/L  Porokeratosis secondary plantar flexed fifth metatarsal  B/L   Debride callus/porokeratosis.  Discussed condition with patient.  RTC 3 months   Helane Gunther DPM

## 2021-06-29 ENCOUNTER — Encounter: Payer: Self-pay | Admitting: Podiatry

## 2021-06-29 ENCOUNTER — Ambulatory Visit (INDEPENDENT_AMBULATORY_CARE_PROVIDER_SITE_OTHER): Payer: Medicare Other | Admitting: Podiatry

## 2021-06-29 ENCOUNTER — Other Ambulatory Visit: Payer: Self-pay

## 2021-06-29 DIAGNOSIS — Q828 Other specified congenital malformations of skin: Secondary | ICD-10-CM | POA: Diagnosis not present

## 2021-06-29 DIAGNOSIS — L84 Corns and callosities: Secondary | ICD-10-CM | POA: Diagnosis not present

## 2021-06-29 DIAGNOSIS — M21621 Bunionette of right foot: Secondary | ICD-10-CM

## 2021-06-29 DIAGNOSIS — M21622 Bunionette of left foot: Secondary | ICD-10-CM

## 2021-06-29 NOTE — Progress Notes (Signed)
This patient present to the office  with chief complaint of callus developing under the outside of ball of left foot.  She says this callus has become painful walking and wearing his shoes. Patient has provided no  treatment or sought professional help.  He  presents to the office for treatment of his painful callus.  Vascular  Dorsalis pedis and posterior tibial pulses are palpable  B/L.  Capillary return  WNL.  Temperature gradient is  WNL.  Skin turgor  WNL  Sensorium  Senn Weinstein monofilament wire  WNL. Normal tactile sensation.  Nail Exam  Patient has normal nails with no evidence of bacterial or fungal infection.  Orthopedic  Exam  Muscle tone and muscle strength  WNL.  No limitations of motion feet  B/L.  No crepitus or joint effusion noted.  Foot type is unremarkable and digits show no abnormalities.  Bony prominences are unremarkable.  Plantar flexed/tailors bunion  fifth metatarsal  B/L.  Skin  No open lesions.  Normal skin texture and turgor.  Callus/porokeratosis  sub 5th left foot.  Porokeratosis secondary plantar flexed fifth metatarsal  B/L   Debride callus/porokeratosis with # 15 blade.  .  Discussed condition with patient.  RTC 3 months   Helane Gunther DPM

## 2021-10-05 ENCOUNTER — Encounter: Payer: Self-pay | Admitting: Podiatry

## 2021-10-05 ENCOUNTER — Ambulatory Visit (INDEPENDENT_AMBULATORY_CARE_PROVIDER_SITE_OTHER): Payer: Medicare Other | Admitting: Podiatry

## 2021-10-05 ENCOUNTER — Other Ambulatory Visit: Payer: Self-pay

## 2021-10-05 ENCOUNTER — Encounter (INDEPENDENT_AMBULATORY_CARE_PROVIDER_SITE_OTHER): Payer: Self-pay

## 2021-10-05 DIAGNOSIS — Q828 Other specified congenital malformations of skin: Secondary | ICD-10-CM | POA: Diagnosis not present

## 2021-10-05 DIAGNOSIS — M21621 Bunionette of right foot: Secondary | ICD-10-CM

## 2021-10-05 DIAGNOSIS — M21622 Bunionette of left foot: Secondary | ICD-10-CM | POA: Diagnosis not present

## 2021-10-05 NOTE — Progress Notes (Signed)
This patient present to the office  with chief complaint of callus developing under the outside of ball of left foot.  She says this callus has become painful walking and wearing his shoes. Patient has provided no  treatment or sought professional help.  He  presents to the office for treatment of his painful callus.  Vascular  Dorsalis pedis and posterior tibial pulses are palpable  B/L.  Capillary return  WNL.  Temperature gradient is  WNL.  Skin turgor  WNL  Sensorium  Senn Weinstein monofilament wire  WNL. Normal tactile sensation.  Nail Exam  Patient has normal nails with no evidence of bacterial or fungal infection.  Orthopedic  Exam  Muscle tone and muscle strength  WNL.  No limitations of motion feet  B/L.  No crepitus or joint effusion noted.  Foot type is unremarkable and digits show no abnormalities.  Bony prominences are unremarkable.  Plantar flexed/tailors bunion  fifth metatarsal  B/L.  Skin  No open lesions.  Normal skin texture and turgor.  Callus/porokeratosis  sub 5th left foot.  Porokeratosis secondary plantar flexed fifth metatarsal  B/L   Debride callus/porokeratosis with # 15 blade.  .  Discussed condition with patient.  RTC 3 months   Helane Gunther DPM

## 2022-01-11 ENCOUNTER — Ambulatory Visit: Payer: Medicare Other | Admitting: Podiatry

## 2022-04-05 ENCOUNTER — Ambulatory Visit (INDEPENDENT_AMBULATORY_CARE_PROVIDER_SITE_OTHER): Payer: Medicare Other | Admitting: Podiatry

## 2022-04-05 ENCOUNTER — Encounter: Payer: Self-pay | Admitting: Podiatry

## 2022-04-05 DIAGNOSIS — Q828 Other specified congenital malformations of skin: Secondary | ICD-10-CM

## 2022-04-05 DIAGNOSIS — M21622 Bunionette of left foot: Secondary | ICD-10-CM

## 2022-04-05 DIAGNOSIS — L84 Corns and callosities: Secondary | ICD-10-CM

## 2022-04-05 DIAGNOSIS — M21621 Bunionette of right foot: Secondary | ICD-10-CM

## 2022-04-05 NOTE — Progress Notes (Signed)
This patient present to the office  with chief complaint of callus developing under the outside of ball of left foot.  She says this callus has become painful walking and wearing his shoes. Patient has provided no  treatment or sought professional help.  He  presents to the office for treatment of his painful callus.  Vascular  Dorsalis pedis and posterior tibial pulses are palpable  B/L.  Capillary return  WNL.  Temperature gradient is  WNL.  Skin turgor  WNL  Sensorium  Senn Weinstein monofilament wire  WNL. Normal tactile sensation.  Nail Exam  Patient has normal nails with no evidence of bacterial or fungal infection.  Orthopedic  Exam  Muscle tone and muscle strength  WNL.  No limitations of motion feet  B/L.  No crepitus or joint effusion noted.  Foot type is unremarkable and digits show no abnormalities.  Bony prominences are unremarkable.  Plantar flexed/tailors bunion  fifth metatarsal  B/L.  Skin  No open lesions.  Normal skin texture and turgor.  Callus/porokeratosis  sub 5th left foot.  Porokeratosis secondary plantar flexed fifth metatarsal  B/L   Debride callus/porokeratosis with # 15 blade.  .  Discussed condition with patient.  RTC 3 months   Helane Gunther DPM

## 2022-08-09 ENCOUNTER — Ambulatory Visit (INDEPENDENT_AMBULATORY_CARE_PROVIDER_SITE_OTHER): Payer: Medicare Other | Admitting: Podiatry

## 2022-08-09 ENCOUNTER — Encounter: Payer: Self-pay | Admitting: Podiatry

## 2022-08-09 DIAGNOSIS — Q828 Other specified congenital malformations of skin: Secondary | ICD-10-CM

## 2022-08-09 DIAGNOSIS — M21622 Bunionette of left foot: Secondary | ICD-10-CM

## 2022-08-09 DIAGNOSIS — M21621 Bunionette of right foot: Secondary | ICD-10-CM

## 2022-08-09 DIAGNOSIS — L84 Corns and callosities: Secondary | ICD-10-CM

## 2022-08-09 NOTE — Progress Notes (Signed)
This patient present to the office  with chief complaint of callus developing under the outside of ball of left foot.  She says this callus has become painful walking and wearing his shoes. Patient has provided no  treatment or sought professional help.  He  presents to the office for treatment of his painful callus.  Vascular  Dorsalis pedis and posterior tibial pulses are palpable  B/L.  Capillary return  WNL.  Temperature gradient is  WNL.  Skin turgor  WNL  Sensorium  Senn Weinstein monofilament wire  WNL. Normal tactile sensation.  Nail Exam  Patient has normal nails with no evidence of bacterial or fungal infection.  Orthopedic  Exam  Muscle tone and muscle strength  WNL.  No limitations of motion feet  B/L.  No crepitus or joint effusion noted.  Foot type is unremarkable and digits show no abnormalities.  Bony prominences are unremarkable.  Plantar flexed/tailors bunion  fifth metatarsal  B/L.  Skin  No open lesions.  Normal skin texture and turgor.  Callus/porokeratosis  sub 5th left foot.  Porokeratosis secondary plantar flexed fifth metatarsal  B/L   Debride callus/porokeratosis with # 15 blade.  .  Discussed condition with patient.  RTC 4  months   Gardiner Barefoot DPM

## 2022-12-13 ENCOUNTER — Ambulatory Visit: Payer: Medicare Other | Admitting: Podiatry

## 2022-12-27 ENCOUNTER — Ambulatory Visit: Payer: Medicare Other | Admitting: Podiatry

## 2023-02-17 ENCOUNTER — Ambulatory Visit (INDEPENDENT_AMBULATORY_CARE_PROVIDER_SITE_OTHER): Payer: Medicare Other | Admitting: Podiatry

## 2023-02-17 ENCOUNTER — Encounter: Payer: Self-pay | Admitting: Podiatry

## 2023-02-17 DIAGNOSIS — Q828 Other specified congenital malformations of skin: Secondary | ICD-10-CM | POA: Diagnosis not present

## 2023-02-17 DIAGNOSIS — M21621 Bunionette of right foot: Secondary | ICD-10-CM

## 2023-02-17 DIAGNOSIS — M21622 Bunionette of left foot: Secondary | ICD-10-CM

## 2023-02-17 NOTE — Progress Notes (Signed)
This patient present to the office  with chief complaint of callus developing under the outside of ball of left foot.  She says this callus has become painful walking and wearing his shoes. Patient has provided no  treatment or sought professional help.  He  presents to the office for treatment of his painful callus.  Vascular  Dorsalis pedis and posterior tibial pulses are palpable  B/L.  Capillary return  WNL.  Temperature gradient is  WNL.  Skin turgor  WNL  Sensorium  Senn Weinstein monofilament wire  WNL. Normal tactile sensation.  Nail Exam  Patient has normal nails with no evidence of bacterial or fungal infection.  Orthopedic  Exam  Muscle tone and muscle strength  WNL.  No limitations of motion feet  B/L.  No crepitus or joint effusion noted.  Foot type is unremarkable and digits show no abnormalities.  Bony prominences are unremarkable.  Plantar flexed/tailors bunion  fifth metatarsal  B/L.  Skin  No open lesions.  Normal skin texture and turgor.  Callus/porokeratosis  sub 5th right  foot.  Porokeratosis secondary plantar flexed fifth metatarsal  B/L   Debride callus/porokeratosis with # 15 blade and dremel tool was use.  .  Discussed condition with patient.  RTC 4  months   Gardiner Barefoot DPM

## 2023-05-30 ENCOUNTER — Ambulatory Visit: Payer: Medicare Other | Admitting: Podiatry

## 2023-05-30 ENCOUNTER — Encounter: Payer: Self-pay | Admitting: Podiatry

## 2023-05-30 VITALS — BP 175/84 | HR 57

## 2023-05-30 DIAGNOSIS — M79672 Pain in left foot: Secondary | ICD-10-CM | POA: Diagnosis not present

## 2023-05-30 DIAGNOSIS — H903 Sensorineural hearing loss, bilateral: Secondary | ICD-10-CM | POA: Insufficient documentation

## 2023-05-30 DIAGNOSIS — M79671 Pain in right foot: Secondary | ICD-10-CM | POA: Diagnosis not present

## 2023-05-30 DIAGNOSIS — Z23 Encounter for immunization: Secondary | ICD-10-CM | POA: Insufficient documentation

## 2023-05-30 DIAGNOSIS — Q828 Other specified congenital malformations of skin: Secondary | ICD-10-CM

## 2023-05-30 DIAGNOSIS — Z461 Encounter for fitting and adjustment of hearing aid: Secondary | ICD-10-CM | POA: Insufficient documentation

## 2023-05-30 DIAGNOSIS — H90A22 Sensorineural hearing loss, unilateral, left ear, with restricted hearing on the contralateral side: Secondary | ICD-10-CM | POA: Insufficient documentation

## 2023-05-30 NOTE — Progress Notes (Signed)
  Subjective:  Patient ID: Joe Yoder, male    DOB: 1946-07-01,  MRN: [id number]  Joe Yoder presents to clinic today for painful porokeratotic lesions b/l lower extremities. Pain prevent(s) comfortable ambulation. Aggravating factor is weightbearing with and without shoegear.  Chief Complaint  Patient presents with   Callouses    "Cut these corns off the side of my feet."   New problem(s): None.   PCP is Deretha Emory, MD.  No Known Allergies  Review of Systems: Negative except as noted in the HPI.  Objective: No changes noted in today's physical examination. Vitals:   05/30/23 1002  BP: (!) 175/84  Pulse: (!) 57   Joe Yoder is a pleasant 77 y.o. male in NAD. AAO x 3.  Vascular Examination: Capillary refill time immediate b/l. Vascular status intact b/l with palpable pedal pulses. Pedal hair absent b/l. No pain with calf compression b/l. Skin temperature gradient WNL b/l. No cyanosis or clubbing b/l. No ischemia or gangrene noted b/l.   Neurological Examination: Sensation grossly intact b/l with 10 gram monofilament. Vibratory sensation intact b/l.   Dermatological Examination: Pedal skin with normal turgor, texture and tone b/l.  No open wounds. No interdigital macerations.   Toenails 1-5 b/l well maintained with adequate length. No erythema, no edema, no drainage, no fluctuance. Porokeratotic lesion(s) submet head 5 b/l. No erythema, no edema, no drainage, no fluctuance.  Musculoskeletal Examination: Normal muscle strength 5/5 to all lower extremity muscle groups bilaterally. Plantarflexed metatarsal(s) 5th metatarsal head b/l lower extremities.. No pain, crepitus or joint limitation noted with ROM b/l LE.  Patient ambulates independently without assistive aids. Tailor's bunion deformity noted b/l LE.  Radiographs: None  Assessment/Plan: 1. Porokeratosis   2. Pain in both feet     -Patient was evaluated and treated. All patient's and/or POA's  questions/concerns answered on today's visit. -Medicare ABN signed for services of paring of corn(s)/callus(es)/porokeratos(es). Copy in patient chart. -Patient to continue soft, supportive shoe gear daily. -Porokeratotic lesion(s) submet head 5 b/l pared and enucleated with sterile currette without incident. Total number of lesions debrided=2. -Patient/POA to call should there be question/concern in the interim.   Return in about 3 months (around 08/30/2023).  Freddie Breech, DPM

## 2023-08-29 ENCOUNTER — Encounter: Payer: Self-pay | Admitting: Podiatry

## 2023-08-29 ENCOUNTER — Ambulatory Visit (INDEPENDENT_AMBULATORY_CARE_PROVIDER_SITE_OTHER): Payer: Medicare Other | Admitting: Podiatry

## 2023-08-29 DIAGNOSIS — M79671 Pain in right foot: Secondary | ICD-10-CM | POA: Diagnosis not present

## 2023-08-29 DIAGNOSIS — Q828 Other specified congenital malformations of skin: Secondary | ICD-10-CM | POA: Diagnosis not present

## 2023-08-29 DIAGNOSIS — M79672 Pain in left foot: Secondary | ICD-10-CM

## 2023-09-04 NOTE — Progress Notes (Signed)
  Subjective:  Patient ID: Joe Yoder, male    DOB: Dec 13, 1945,  MRN: [id number]  77 y.o. male presents painful porokeratotic lesions of both feet. Pain prevent(s) comfortable ambulation. Aggravating factor is weightbearing with and without shoegear.  Chief Complaint  Patient presents with   RFC    NAILS    New problem(s): None   PCP is Deretha Emory, MD0  No Known Allergies  Review of Systems: Negative except as noted in the HPI.   Objective:  Ariya Buhrman Petit is a pleasant 77 y.o. male in NAD.Marland Kitchen AAO x 3.  Vascular Examination: Vascular status intact b/l with palpable pedal pulses. CFT immediate b/l. Pedal hair present. No edema. No pain with calf compression b/l. Skin temperature gradient WNL b/l. No varicosities noted. No cyanosis or clubbing noted.  Neurological Examination: Sensation grossly intact b/l with 10 gram monofilament. Vibratory sensation intact b/l.  Dermatological Examination: Pedal skin with normal turgor, texture and tone b/l. No open wounds nor interdigital macerations noted.   Toenails 1-5 b/l well maintained with adequate length. No erythema, no edema, no drainage, no fluctuance. Porokeratotic lesion(s) submet head 5 b/l. No erythema, no edema, no drainage, no fluctuance.  Musculoskeletal Examination: Muscle strength 5/5 to b/l LE.  No pain, crepitus noted b/l.Plantarflexed metatarsal(s) 5th metatarsal head b/l feet.   Radiographs: None  Last A1c:       No data to display         Assessment:   1. Porokeratosis   2. Pain in both feet    Plan:  -Consent given for treatment as described below: -Examined patient. -Continue supportive shoe gear daily. -Porokeratotic lesion(s) submet head 5 b/l pared and enucleated with sterile currette without incident. Total number of lesions debrided=2. -Patient/POA to call should there be question/concern in the interim.  Return in about 3 months (around 11/29/2023).  Freddie Breech, DPM

## 2023-11-28 ENCOUNTER — Ambulatory Visit (INDEPENDENT_AMBULATORY_CARE_PROVIDER_SITE_OTHER): Payer: Medicare Other | Admitting: Podiatry

## 2023-11-28 ENCOUNTER — Encounter: Payer: Self-pay | Admitting: Podiatry

## 2023-11-28 VITALS — Ht 72.0 in | Wt 150.0 lb

## 2023-11-28 DIAGNOSIS — M79671 Pain in right foot: Secondary | ICD-10-CM | POA: Diagnosis not present

## 2023-11-28 DIAGNOSIS — M79672 Pain in left foot: Secondary | ICD-10-CM | POA: Diagnosis not present

## 2023-11-28 DIAGNOSIS — Q828 Other specified congenital malformations of skin: Secondary | ICD-10-CM

## 2023-12-05 NOTE — Progress Notes (Signed)
  Subjective:  Patient ID: Joe Yoder, male    DOB: 09-Aug-1946,  MRN: [id number]  78 y.o. male presents to clinic with  painful porokeratotic lesions b/l feet. Pain prevent(s) comfortable ambulation. Aggravating factor is weightbearing with and without shoegear.  Chief Complaint  Patient presents with   Nail Problem    Pt is here for RFC not a diabetic PCP is Dr Lind Covert and LOV was 6 months ago   New problem(s): None   PCP is Deretha Emory, MD.  No Known Allergies  Review of Systems: Negative except as noted in the HPI.   Objective:  Joe Yoder is a pleasant 78 y.o. male WD, WN in NAD. AAO x 3.  Vascular Examination: Vascular status intact b/l with palpable pedal pulses. CFT immediate b/l. No edema. No pain with calf compression b/l. Skin temperature gradient WNL b/l. No cyanosis or clubbing noted b/l LE.  Neurological Examination: Sensation grossly intact b/l with 10 gram monofilament.   Dermatological Examination: Pedal skin with normal turgor, texture and tone b/l.   Toenails recently debrided. Porokeratotic lesion(s) submet head 5 left foot and submet head 5 right foot. No erythema, no edema, no drainage, no fluctuance.  Musculoskeletal Examination: Muscle strength 5/5 to b/l LE. Plantarflexed metatarsal(s) 5th metatarsal head of both feet.  Radiographs: None  Last A1c:       No data to display           Assessment:   1. Porokeratosis   2. Pain in both feet    Plan:  -Patient was evaluated today. All questions/concerns addressed on today's visit. -Patient to continue soft, supportive shoe gear daily. -Porokeratotic lesion(s) submet head 5 left foot and submet head 5 right foot pared and enucleated with sterile currette without incident. Total number of lesions debrided=2. -Patient/POA to call should there be question/concern in the interim.  Return in about 3 months (around 02/26/2024).  Freddie Breech, DPM      [place]  LOCATION: [address], [place] [address]                   Office [phone]   [facility] LOCATION: [address] [place], [place] [address] Office [phone]

## 2024-02-27 ENCOUNTER — Encounter: Payer: Self-pay | Admitting: Podiatry

## 2024-02-27 ENCOUNTER — Ambulatory Visit (INDEPENDENT_AMBULATORY_CARE_PROVIDER_SITE_OTHER): Payer: Medicare Other | Admitting: Podiatry

## 2024-02-27 VITALS — Ht 72.0 in | Wt 150.0 lb

## 2024-02-27 DIAGNOSIS — M79672 Pain in left foot: Secondary | ICD-10-CM | POA: Diagnosis not present

## 2024-02-27 DIAGNOSIS — Q828 Other specified congenital malformations of skin: Secondary | ICD-10-CM

## 2024-02-27 DIAGNOSIS — M79671 Pain in right foot: Secondary | ICD-10-CM

## 2024-03-01 ENCOUNTER — Encounter: Payer: Self-pay | Admitting: Podiatry

## 2024-03-01 NOTE — Progress Notes (Signed)
  Subjective:  Patient ID: Joe Yoder, male    DOB: 08-01-1946,  MRN: [id number]  Joe Yoder presents to clinic today for painful porokeratotic lesions of both feet. Pain prevent(s) comfortable ambulation. Aggravating factor is weightbearing with and without shoegear.  Chief Complaint  Patient presents with   Nail Problem    Pt is here for [facility] PCP is Dr Meryl Acosta and LOV was in May.   New problem(s): None.   PCP is Raji, Lawton Price, NP.  No Known Allergies  Review of Systems: Negative except as noted in the HPI.  Objective: No changes noted in today's physical examination. There were no vitals filed for this visit. Joe Yoder is a pleasant 78 y.o. male WD, WN in NAD. AAO x 3.  Vascular Examination: Vascular status intact b/l with palpable pedal pulses. CFT immediate b/l. No edema. No pain with calf compression b/l. Skin temperature gradient WNL b/l. No cyanosis or clubbing noted b/l LE.  Neurological Examination: Sensation grossly intact b/l with 10 gram monofilament.   Dermatological Examination: Pedal skin with normal turgor, texture and tone b/l.   Toenails recently debrided. Porokeratotic lesion(s) submet head 5 left foot and submet head 5 right foot. No erythema, no edema, no drainage, no fluctuance.  Musculoskeletal Examination: Muscle strength 5/5 to b/l LE. Plantarflexed metatarsal(s) 5th metatarsal head of both feet.  Radiographs: None  Assessment/Plan: 1. Porokeratosis   2. Pain in both feet   -Consent given for treatment as described below: -Examined patient. -Continue supportive shoe gear daily. -Porokeratotic lesion(s) submet head 5 b/l pared and enucleated with sterile currette without incident. Total number of lesions debrided=2. -Patient/POA to call should there be question/concern in the interim.   Return in about 3 months (around 05/28/2024).  Luella Sager, DPM      [place] LOCATION: [address] [place] [address]                   Office [phone]   [facility] LOCATION: [address] [place], [place] [address] Office [phone]

## 2024-06-04 ENCOUNTER — Ambulatory Visit: Admitting: Podiatry

## 2024-06-08 ENCOUNTER — Ambulatory Visit (INDEPENDENT_AMBULATORY_CARE_PROVIDER_SITE_OTHER): Admitting: Podiatry

## 2024-06-08 ENCOUNTER — Encounter: Payer: Self-pay | Admitting: Podiatry

## 2024-06-08 DIAGNOSIS — Q828 Other specified congenital malformations of skin: Secondary | ICD-10-CM

## 2024-06-08 DIAGNOSIS — M79672 Pain in left foot: Secondary | ICD-10-CM | POA: Diagnosis not present

## 2024-06-11 NOTE — Progress Notes (Signed)
  Subjective:  Patient ID: Joe Yoder, male    DOB: 1946-04-19,  MRN: [id number]  78 y.o. male presents to clinic with  painful porokeratotic lesions left lower extremity. Pain prevent(s) comfortable ambulation. Aggravating factor is weightbearing with and without shoegear.  Chief Complaint  Patient presents with   Callouses    Painful callus lesions, 3 month follow up     New problem(s): None   PCP is Raji, Arlyne BROCKS, NP.  No Known Allergies  Review of Systems: Negative except as noted in the HPI.   Objective:  Joe Yoder is a pleasant 78 y.o. male WD, WN in NAD. AAO x 3.  Vascular Examination: Vascular status intact b/l with palpable pedal pulses. Pedal hair sparse. CFT immediate b/l. No edema. No pain with calf compression b/l. Skin temperature gradient WNL b/l. No cyanosis or clubbing noted b/l LE.  Neurological Examination: Sensation grossly intact b/l with 10 gram monofilament. Vibratory sensation intact b/l.   Dermatological Examination: Pedal skin with normal turgor, texture and tone b/l. Toenails 1-5 b/l well maintained with adequate length. No erythema, no edema, no drainage, no fluctuance. Porokeratotic lesion(s) submet head 5 left foot. No erythema, no edema, no drainage, no fluctuance.  Musculoskeletal Examination: Muscle strength 5/5 to b/l LE. Plantarflexed metatarsal(s) 5th metatarsal head b/l lower extremities.  Radiographs: None  Assessment:   1. Porokeratosis   2. Pain in left foot    Plan:  -Patient was evaluated today. All questions/concerns addressed on today's visit. -Patient to continue soft, supportive shoe gear daily. -Porokeratotic lesion(s) submet head 5 left foot pared and enucleated with sterile currette without incident. Total number of lesions debrided=1. -Patient/POA to call should there be question/concern in the interim.  Return in about 3 months (around 09/08/2024).  Delon LITTIE Merlin, DPM      [place] LOCATION: [address], [place] [address]                   Office [phone]   [facility] LOCATION: [address] [place], [place] [address] Office [phone]

## 2024-09-07 ENCOUNTER — Ambulatory Visit: Admitting: Podiatry

## 2024-09-07 ENCOUNTER — Encounter: Payer: Self-pay | Admitting: Podiatry

## 2024-09-07 DIAGNOSIS — M79671 Pain in right foot: Secondary | ICD-10-CM | POA: Diagnosis not present

## 2024-09-07 DIAGNOSIS — Q828 Other specified congenital malformations of skin: Secondary | ICD-10-CM

## 2024-09-07 DIAGNOSIS — M79672 Pain in left foot: Secondary | ICD-10-CM

## 2024-09-14 NOTE — Progress Notes (Signed)
  Subjective:  Patient ID: Joe Yoder, male    DOB: 07/17/1946,  MRN: [id number]  Will PARAS Vierra presents to clinic today for painful porokeratotic lesions of both feet. Pain prevent(s) comfortable ambulation. Aggravating factor is weightbearing with and without shoegear.  Chief Complaint  Patient presents with   Toe Pain    NP Raji is his PCP. Last visit was in May. Denies being diabetic   New problem(s): None.   PCP is Raji, Arlyne BROCKS, NP.  No Known Allergies  Review of Systems: Negative except as noted in the HPI.  Objective:  There were no vitals filed for this visit. Joe Yoder is a pleasant 78 y.o. male WD, WN in NAD. AAO x 3.  Vascular Examination: Vascular status intact b/l with palpable pedal pulses. Pedal hair sparse. CFT immediate b/l. No edema. No pain with calf compression b/l. Skin temperature gradient WNL b/l. No cyanosis or clubbing noted b/l LE.  Neurological Examination: Sensation grossly intact b/l with 10 gram monofilament. Vibratory sensation intact b/l.   Dermatological Examination: Pedal skin with normal turgor, texture and tone b/l. Toenails 1-5 b/l well maintained with adequate length. No erythema, no edema, no drainage, no fluctuance. Porokeratotic lesion(s) submet head 5 b/l.  No erythema, no edema, no drainage, no fluctuance.  Musculoskeletal Examination: Muscle strength 5/5 to b/l LE. Plantarflexed metatarsal(s) 5th metatarsal head b/l lower extremities.  Radiographs: None  Assessment/Plan: 1. Porokeratosis   2. Pain in both feet   -Consent given for treatment as described below: -Examined patient. -Patient to continue soft, supportive shoe gear daily. -Porokeratotic lesion(s) submet head 5 b/l pared and enucleated with sterile currette without incident. Total number of lesions debrided=2. -Patient/POA to call should there be question/concern in the interim.   Return in about 3 months (around 12/08/2024).  Delon LITTIE Merlin, DPM        LOCATION: [address], [place] [address]                   Office [phone]   [facility] LOCATION: [address] [place], [place] [address] Office [phone]

## 2024-12-20 ENCOUNTER — Ambulatory Visit: Admitting: Podiatry

## 2025-01-25 ENCOUNTER — Ambulatory Visit: Admitting: Podiatry
